# Patient Record
Sex: Female | Born: 1995
Health system: Southern US, Community
[De-identification: ages and names within clinical notes are randomized; demographics above are authoritative.]

## PROBLEM LIST (undated history)

## (undated) DIAGNOSIS — F32A Depression, unspecified: Secondary | ICD-10-CM

## (undated) DIAGNOSIS — R519 Headache, unspecified: Secondary | ICD-10-CM

## (undated) DIAGNOSIS — F419 Anxiety disorder, unspecified: Secondary | ICD-10-CM

## (undated) HISTORY — DX: Headache, unspecified: R51.9

---

## 2016-12-03 DIAGNOSIS — F411 Generalized anxiety disorder: Secondary | ICD-10-CM | POA: Diagnosis not present

## 2017-02-11 DIAGNOSIS — F411 Generalized anxiety disorder: Secondary | ICD-10-CM | POA: Diagnosis not present

## 2017-02-11 DIAGNOSIS — F321 Major depressive disorder, single episode, moderate: Secondary | ICD-10-CM | POA: Diagnosis not present

## 2017-04-03 DIAGNOSIS — Z113 Encounter for screening for infections with a predominantly sexual mode of transmission: Secondary | ICD-10-CM | POA: Diagnosis not present

## 2017-04-03 DIAGNOSIS — Z3009 Encounter for other general counseling and advice on contraception: Secondary | ICD-10-CM | POA: Diagnosis not present

## 2017-04-03 DIAGNOSIS — Z124 Encounter for screening for malignant neoplasm of cervix: Secondary | ICD-10-CM | POA: Diagnosis not present

## 2017-04-03 DIAGNOSIS — Z1151 Encounter for screening for human papillomavirus (HPV): Secondary | ICD-10-CM | POA: Diagnosis not present

## 2017-04-03 DIAGNOSIS — Z3041 Encounter for surveillance of contraceptive pills: Secondary | ICD-10-CM | POA: Diagnosis not present

## 2017-11-20 DIAGNOSIS — F324 Major depressive disorder, single episode, in partial remission: Secondary | ICD-10-CM | POA: Diagnosis not present

## 2017-11-20 DIAGNOSIS — F411 Generalized anxiety disorder: Secondary | ICD-10-CM | POA: Diagnosis not present

## 2018-05-06 DIAGNOSIS — R87610 Atypical squamous cells of undetermined significance on cytologic smear of cervix (ASC-US): Secondary | ICD-10-CM | POA: Diagnosis not present

## 2018-05-06 DIAGNOSIS — Z01419 Encounter for gynecological examination (general) (routine) without abnormal findings: Secondary | ICD-10-CM | POA: Diagnosis not present

## 2018-05-06 DIAGNOSIS — Z113 Encounter for screening for infections with a predominantly sexual mode of transmission: Secondary | ICD-10-CM | POA: Diagnosis not present

## 2018-11-05 DIAGNOSIS — J069 Acute upper respiratory infection, unspecified: Secondary | ICD-10-CM | POA: Diagnosis not present

## 2018-12-21 DIAGNOSIS — G43909 Migraine, unspecified, not intractable, without status migrainosus: Secondary | ICD-10-CM | POA: Diagnosis not present

## 2019-02-01 ENCOUNTER — Other Ambulatory Visit: Payer: Self-pay

## 2019-02-01 ENCOUNTER — Ambulatory Visit: Payer: BC Managed Care – PPO | Admitting: Neurology

## 2019-02-01 ENCOUNTER — Encounter: Payer: Self-pay | Admitting: Neurology

## 2019-02-01 DIAGNOSIS — G441 Vascular headache, not elsewhere classified: Secondary | ICD-10-CM

## 2019-02-01 DIAGNOSIS — R519 Headache, unspecified: Secondary | ICD-10-CM | POA: Insufficient documentation

## 2019-02-01 MED ORDER — TOPIRAMATE 25 MG PO TABS: 50.0000 mg | ORAL_TABLET | Freq: Every day | ORAL | 3 refills | Status: AC

## 2019-02-01 MED ORDER — SUMATRIPTAN SUCCINATE 50 MG PO TABS: 50.0000 mg | ORAL_TABLET | ORAL | 1 refills | Status: AC | PRN

## 2019-02-01 NOTE — Patient Instructions (Signed)
Topiramate tablets What is this medicine? TOPIRAMATE (toe PYRE a mate) is used to treat seizures in adults or children with epilepsy. It is also used for the prevention of migraine headaches. This medicine may be used for other purposes; ask your health care provider or pharmacist if you have questions. COMMON BRAND NAME(S): Topamax, Topiragen What should I tell my health care provider before I take this medicine? They need to know if you have any of these conditions:  bleeding disorders  cirrhosis of the liver or liver disease  diarrhea  glaucoma  kidney stones or kidney disease  low blood counts, like low white cell, platelet, or red cell counts  lung disease like asthma, obstructive pulmonary disease, emphysema  metabolic acidosis  on a ketogenic diet  schedule for surgery or a procedure  suicidal thoughts, plans, or attempt; a previous suicide attempt by you or a family member  an unusual or allergic reaction to topiramate, other medicines, foods, dyes, or preservatives  pregnant or trying to get pregnant  breast-feeding How should I use this medicine? Take this medicine by mouth with a glass of water. Follow the directions on the prescription label. Do not crush or chew. You may take this medicine with meals. Take your medicine at regular intervals. Do not take it more often than directed. Talk to your pediatrician regarding the use of this medicine in children. Special care may be needed. While this drug may be prescribed for children as young as 2 years of age for selected conditions, precautions do apply. Overdosage: If you think you have taken too much of this medicine contact a poison control center or emergency room at once. NOTE: This medicine is only for you. Do not share this medicine with others. What if I miss a dose? If you miss a dose, take it as soon as you can. If your next dose is to be taken in less than 6 hours, then do not take the missed dose. Take  the next dose at your regular time. Do not take double or extra doses. What may interact with this medicine? Do not take this medicine with any of the following medications:  probenecid This medicine may also interact with the following medications:  acetazolamide  alcohol  amitriptyline  aspirin and aspirin-like medicines  birth control pills  certain medicines for depression  certain medicines for seizures  certain medicines that treat or prevent blood clots like warfarin, enoxaparin, dalteparin, apixaban, dabigatran, and rivaroxaban  digoxin  hydrochlorothiazide  lithium  medicines for pain, sleep, or muscle relaxation  metformin  methazolamide  NSAIDS, medicines for pain and inflammation, like ibuprofen or naproxen  pioglitazone  risperidone This list may not describe all possible interactions. Give your health care provider a list of all the medicines, herbs, non-prescription drugs, or dietary supplements you use. Also tell them if you smoke, drink alcohol, or use illegal drugs. Some items may interact with your medicine. What should I watch for while using this medicine? Visit your doctor or health care professional for regular checks on your progress. Do not stop taking this medicine suddenly. This increases the risk of seizures if you are using this medicine to control epilepsy. Wear a medical identification bracelet or chain to say you have epilepsy or seizures, and carry a card that lists all your medicines. This medicine can decrease sweating and increase your body temperature. Watch for signs of deceased sweating or fever, especially in children. Avoid extreme heat, hot baths, and saunas. Be careful   about exercising, especially in hot weather. Contact your health care provider right away if you notice a fever or decrease in sweating. You should drink plenty of fluids while taking this medicine. If you have had kidney stones in the past, this will help to reduce  your chances of forming kidney stones. If you have stomach pain, with nausea or vomiting and yellowing of your eyes or skin, call your doctor immediately. You may get drowsy, dizzy, or have blurred vision. Do not drive, use machinery, or do anything that needs mental alertness until you know how this medicine affects you. To reduce dizziness, do not sit or stand up quickly, especially if you are an older patient. Alcohol can increase drowsiness and dizziness. Avoid alcoholic drinks. If you notice blurred vision, eye pain, or other eye problems, seek medical attention at once for an eye exam. The use of this medicine may increase the chance of suicidal thoughts or actions. Pay special attention to how you are responding while on this medicine. Any worsening of mood, or thoughts of suicide or dying should be reported to your health care professional right away. This medicine may increase the chance of developing metabolic acidosis. If left untreated, this can cause kidney stones, bone disease, or slowed growth in children. Symptoms include breathing fast, fatigue, loss of appetite, irregular heartbeat, or loss of consciousness. Call your doctor immediately if you experience any of these side effects. Also, tell your doctor about any surgery you plan on having while taking this medicine since this may increase your risk for metabolic acidosis. Birth control pills may not work properly while you are taking this medicine. Talk to your doctor about using an extra method of birth control. Women who become pregnant while using this medicine may enroll in the [place] American Antiepileptic Drug Pregnancy Registry by calling 1-888-233-2334. This registry collects information about the safety of antiepileptic drug use during pregnancy. What side effects may I notice from receiving this medicine? Side effects that you should report to your doctor or health care professional as soon as possible:  allergic reactions like  skin rash, itching or hives, swelling of the face, lips, or tongue  decreased sweating and/or rise in body temperature  depression  difficulty breathing, fast or irregular breathing patterns  difficulty speaking  difficulty walking or controlling muscle movements  hearing impairment  redness, blistering, peeling or loosening of the skin, including inside the mouth  tingling, pain or numbness in the hands or feet  unusual bleeding or bruising  unusually weak or tired  worsening of mood, thoughts or actions of suicide or dying Side effects that usually do not require medical attention (report to your doctor or health care professional if they continue or are bothersome):  altered taste  back pain, joint or muscle aches and pains  diarrhea, or constipation  headache  loss of appetite  nausea  stomach upset, indigestion  tremors This list may not describe all possible side effects. Call your doctor for medical advice about side effects. You may report side effects to FDA at 1-800-FDA-1088. Where should I keep my medicine? Keep out of the reach of children. Store at room temperature between 15 and 30 degrees C (59 and 86 degrees F) in a tightly closed container. Protect from moisture. Throw away any unused medicine after the expiration date. NOTE: This sheet is a summary. It may not cover all possible information. If you have questions about this medicine, talk to your doctor, pharmacist, or health care   provider.  2020 Elsevier/Gold Standard (2013-03-28 23:17:57) Sumatriptan tablets What is this medicine? SUMATRIPTAN (soo ma TRIP tan) is used to treat migraines with or without aura. An aura is a strange feeling or visual disturbance that warns you of an attack. It is not used to prevent migraines. This medicine may be used for other purposes; ask your health care provider or pharmacist if you have questions. COMMON BRAND NAME(S): Imitrex, Migraine Pack What should I  tell my health care provider before I take this medicine? They need to know if you have any of these conditions:  cigarette smoker  circulation problems in fingers and toes  diabetes  heart disease  high blood pressure  high cholesterol  history of irregular heartbeat  history of stroke  kidney disease  liver disease  stomach or intestine problems  an unusual or allergic reaction to sumatriptan, other medicines, foods, dyes, or preservatives  pregnant or trying to get pregnant  breast-feeding How should I use this medicine? Take this medicine by mouth with a glass of water. Follow the directions on the prescription label. Do not take it more often than directed. Talk to your pediatrician regarding the use of this medicine in children. Special care may be needed. Overdosage: If you think you have taken too much of this medicine contact a poison control center or emergency room at once. NOTE: This medicine is only for you. Do not share this medicine with others. What if I miss a dose? This does not apply. This medicine is not for regular use. What may interact with this medicine? Do not take this medicine with any of the following medicines:  certain medicines for migraine headache like almotriptan, eletriptan, frovatriptan, naratriptan, rizatriptan, sumatriptan, zolmitriptan  ergot alkaloids like dihydroergotamine, ergonovine, ergotamine, methylergonovine  MAOIs like Carbex, Eldepryl, Marplan, Nardil, and Parnate This medicine may also interact with the following medications:  certain medicines for depression, anxiety, or psychotic disorders This list may not describe all possible interactions. Give your health care provider a list of all the medicines, herbs, non-prescription drugs, or dietary supplements you use. Also tell them if you smoke, drink alcohol, or use illegal drugs. Some items may interact with your medicine. What should I watch for while using this  medicine? Visit your healthcare professional for regular checks on your progress. Tell your healthcare professional if your symptoms do not start to get better or if they get worse. You may get drowsy or dizzy. Do not drive, use machinery, or do anything that needs mental alertness until you know how this medicine affects you. Do not stand up or sit up quickly, especially if you are an older patient. This reduces the risk of dizzy or fainting spells. Alcohol may interfere with the effect of this medicine. Tell your healthcare professional right away if you have any change in your eyesight. If you take migraine medicines for 10 or more days a month, your migraines may get worse. Keep a diary of headache days and medicine use. Contact your healthcare professional if your migraine attacks occur more frequently. What side effects may I notice from receiving this medicine? Side effects that you should report to your doctor or health care professional as soon as possible:  allergic reactions like skin rash, itching or hives, swelling of the face, lips, or tongue  changes in vision  chest pain or chest tightness  signs and symptoms of a dangerous change in heartbeat or heart rhythm like chest pain; dizziness; fast, irregular heartbeat; palpitations; feeling  faint or lightheaded; falls; breathing problems  signs and symptoms of a stroke like changes in vision; confusion; trouble speaking or understanding; severe headaches; sudden numbness or weakness of the face, arm or leg; trouble walking; dizziness; loss of balance or coordination  signs and symptoms of serotonin syndrome like irritable; confusion; diarrhea; fast or irregular heartbeat; muscle twitching; stiff muscles; trouble walking; sweating; high fever; seizures; chills; vomiting Side effects that usually do not require medical attention (report to your doctor or health care professional if they continue or are  bothersome):  diarrhea  dizziness  drowsiness  dry mouth  headache  nausea, vomiting  pain, tingling, numbness in the hands or feet  stomach pain This list may not describe all possible side effects. Call your doctor for medical advice about side effects. You may report side effects to FDA at 1-800-FDA-1088. Where should I keep my medicine? Keep out of the reach of children. Store at room temperature between 2 and 30 degrees C (36 and 86 degrees F). Throw away any unused medicine after the expiration date. NOTE: This sheet is a summary. It may not cover all possible information. If you have questions about this medicine, talk to your doctor, pharmacist, or health care provider.  2020 Elsevier/Gold Standard (2017-10-06 15:05:37)

## 2019-02-01 NOTE — Progress Notes (Signed)
.       Provider:  Melvyn Novasarmen  Landri Dorsainvil, MD  Primary Care Physician:  Deatra JamesSun, Vyvyan, MD [phone] [address] [place] [id number]     Referring Provider: Assunta FoundSullivan, Emily B, Gean Birchwooda [address] [place] [id number]  [facility]          Chief Complaint according to patient   Patient presents with:    . New Patient (Initial Visit)           HISTORY OF PRESENT ILLNESS:  Anna Bennett is a 23 y.o. 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  Seen on 02/01/2019 for migraine.    Chief concern according to patient :  See below    I have the pleasure of seeing Anna Bennett today, a right-handed female with a headache disorder. She  has a past medical history of Headache.  Anna Bennett reports that she encountered migraines already before puberty in childhood.  There has been a strong maternal family history with her mother being affected by migraines as well as a maternal cousin. It was after the patient began taking birth control pills that her migraines became very frequent more intense, and having up to 3 times a week.  This exacerbation took probably place around 2-1/2 years ago.  She is taking Sprintec but stopped taking this medication in mid September 2020, Sprintec contains norgestimate ethinyl estradiol and 2 different doses with a 28-day pack.  She states that the headaches sometimes last 3 days ago which would constitute a status migrainosus.  The pain is intermittent and occurs with a frequency of a few times a week but rarely does it last 7 days. She localized mostly to the right side, she has undergone migraine piercing , and believes this already has reduced the intensity.  Nausea with migraine- rarely vomiting , photophobia, phonophobia.  She has woken up with migraines, mostly the migraines evolve later in th day and get more intense as the day goes on.  Ibuprofen and midol has been the only medication tried.      Sleep habits are as follows: The patient's dinner time is between 6 PM. The patient goes to bed at 11 PM and continues to be awake until 3-4 AM, she  sleep for 6 hours. wakes for 1-2 bathroom breaks. The preferred sleep position is any position, with the support of 2 pillows and a lumbar pillow.  Dreams are reportedly rare.  9-10  AM is the usual rise time. The patient wakes up spontaneously with an alarm.  She reports not feeling refreshed or restored in AM, Naps are taken in frequently.  She is avoiding caffeine.   Review of Systems: Out of a complete 14 system review, the patient complains of only the following symptoms, and all other reviewed systems are negative.:  Fatigue, sleepiness , snoring, fragmented sleep, Insomnia , delayed sleep , migraine.    How likely are you to doze in the following situations: 0 = not likely, 1 = slight chance, 2 = moderate chance, 3 = high chance   Sitting and Reading? Watching Television? Sitting inactive in a public place (theater or meeting)? As a passenger in a car for an hour without a break? Lying down in the afternoon when circumstances permit? Sitting and talking to someone? Sitting quietly after lunch without alcohol? In a car, while stopped for a few minutes in traffic?   Total =  NA / 24 points     Social History   Socioeconomic History  . Marital status: Single    Spouse name: Not on file  . Number of children: Not on file  . Years of education: Not on file  . Highest education level: Not on file  Occupational History  . Not on file  Social Needs  . Financial resource strain: Not on file  . Food insecurity    Worry: Not on file    Inability: Not on file  . Transportation needs    Medical: Not on file    Non-medical: Not on file  Tobacco Use  . Smoking status: Never Smoker  . Smokeless tobacco: Never Used  Substance and Sexual Activity  . Alcohol use: Yes    Alcohol/week: 2.0 standard drinks    Types: 1 Glasses of wine,  1 Shots of liquor per week  . Drug use: Not Currently  . Sexual activity: Not on file  Lifestyle  . Physical activity    Days per week: Not on file    Minutes per session: Not on file  . Stress: Not on file  Relationships  . Social Musician on phone: Not on file    Gets together: Not on file    Attends religious service: Not on file    Active member of club or organization: Not on file    Attends meetings of clubs or organizations: Not on file    Relationship status: Not on file  Other Topics Concern  . Not on file  Social History Narrative  . Not on file    History reviewed. No pertinent family history.  Past Medical History:  Diagnosis Date  . Headache     History reviewed. No pertinent surgical history.   No current outpatient medications on file prior to visit.   No current facility-administered medications on file prior to visit.     Allergies  Allergen Reactions  . Sulfa Antibiotics     Physical exam:  Today's Vitals   02/01/19 1409  BP: 124/75  Pulse: 82  Temp: 97.7 F (36.5 C)  Weight: 135 lb (61.2 kg)  Height: 5\' 5"  (1.651 m)   Body mass index is 22.47 kg/m.   Wt Readings from Last 3 Encounters:  02/01/19 135 lb (61.2 kg)     Ht Readings from Last 3 Encounters:  02/01/19 5\' 5"  (1.651 m)      General: The patient is awake, alert and appears not in acute distress. The patient is well groomed. Head: Normocephalic, atraumatic. Neck is supple.  neck circumference: 14 inches . Nasal airflow is patent. She has frequent congestion. No postnasal drip.  Retrognathia is not  seen.  Dental status: normal  Cardiovascular:  Regular rate Respiratory: Lungs are clear  Skin:  Without evidence of ankle edema, or rash.  Tattooes.  Tender spots on both temples.  Trunk: The patient's posture is erect.   Neurologic exam : The patient is awake and alert, oriented to place and time.   Memory subjective described as intact.  Attention span &  concentration ability appears normal.  Speech is fluent,  without  dysarthria, dysphonia or aphasia.  Mood and affect are appropriate.   Cranial nerves: no loss of smell or taste reported  Pupils are equal and briskly reactive to light. Funduscopic exam deferred.  Extraocular movements in vertical and horizontal planes were intact and without nystagmus. No Diplopia. Visual fields by finger perimetry are intact. Hearing  was intact to soft voice and finger rubbing.    Facial sensation intact to fine touch.  Facial motor strength is symmetric and tongue and uvula move midline.  Neck ROM : rotation, tilt and flexion extension were normal for age and shoulder shrug was symmetrical.    Motor exam:  Symmetric bulk, tone and ROM.   Normal tone without cog wheeling, symmetric grip strength. Sensory:  Fine touch, pinprick and vibration were tested  and  normal.  Proprioception tested in the upper extremities was normal. Coordination: Rapid alternating movements in the fingers/hands were of normal speed.  The Finger-to-nose maneuver was intact without evidence of ataxia, dysmetria or tremor.   Gait and station: Patient walked without assistive device.  Stance is of normal width/ Deep tendon reflexes: in the upper and lower extremities are symmetric and intact.  Babinski response was deferred.        After spending a total time of 40  minutes face to face and additional time for physical and neurologic examination, review of laboratory studies,  personal review of imaging studies, reports and results of other testing and review of referral information / records as far as provided in visit, I have established the following assessments:  1) patient with long standing migrainous headaches that became more frequent as she took hormonal contraceptives.  Now easily more days with headaches than without.  Visual auras with many of the migraines, preceding the headaches 30 minutes.   2) migraine  temporal, nausea, photo- and phono- phobia.     My Plan is to proceed with:  1)will start a headache journal   2) improve sleep routines.   3) Topiramate 25 mg , we start one at night and after 8 days  go to 2 at night.    4) Imitrex.   I would like to thank Donald Prose, MD and Lula Olszewski, [place],  [place] [address] for allowing me to meet with and to take care of this pleasant patient.  I plan to follow up either personally or through our NP within 4-6 month.   CC: I will share my notes with PCP   Electronically signed by: Larey Seat, MD 02/01/2019 3:00 PM  [facility] Sleep Board certified by The [employer] of Sleep Medicine and Diplomate of the [employer] of Sleep Medicine. Board certified In Neurology through the [place], Fellow of the [employer] of Neurology. Medical Director of [employer].

## 2019-02-07 ENCOUNTER — Telehealth: Payer: Self-pay | Admitting: Neurology

## 2019-02-07 NOTE — Telephone Encounter (Signed)
no to covid questions MR Brain wo contrast Dr. Rolla Etienne Auth: [id number] (exp. 02/07/19 to 08/05/19). Patient is scheduled at [facility] for 03/09/19.

## 2019-02-09 NOTE — Telephone Encounter (Signed)
Pt called in and stated she has ear piercing's that cant be taken out and she wants to know what she can do

## 2019-02-09 NOTE — Telephone Encounter (Signed)
I spoke to the patient and informed her that she would have to go to a tattoo place and ask them if they can put a little plastic piece in place of the piercing. She stated she would give them a call to see if they can do that.

## 2019-03-01 ENCOUNTER — Telehealth: Payer: Self-pay | Admitting: Neurology

## 2019-03-01 NOTE — Telephone Encounter (Signed)
Called the pt to question exactly what she was asking. Patient states that she had increased her medication and was tolerating the 2 at bedtime. She went a couple days without her dose and her pharmacy was wanting to confirm with [place] if it is ok with her to resume the 2 at bedtime since she was without the medication for 2 days advised that would be fine especially since she was tolerating the dosage before. Patient has MRI scheduled 12/2 and then a follow up visit in [place] and advised that I would call with the results when I get them and we will look forward for seeing her at the visit in jan. Pt verbalized understanding. Pt had no questions at this time but was encouraged to call back if questions arise.

## 2019-03-01 NOTE — Telephone Encounter (Signed)
Patient called stating that when she initially received her topiramate she began taking 8 pills a day however, during this time she was moving and stopped taking her medication and was advised by the pharmacist to take one a night and to FU about what dosage she should be taking since stoping and resuming medication. Please follow up.

## 2019-03-09 ENCOUNTER — Other Ambulatory Visit: Payer: Self-pay

## 2019-03-09 ENCOUNTER — Ambulatory Visit: Payer: BC Managed Care – PPO

## 2019-03-09 DIAGNOSIS — G441 Vascular headache, not elsewhere classified: Secondary | ICD-10-CM

## 2019-03-09 MED ORDER — GADOBENATE DIMEGLUMINE 529 MG/ML IV SOLN: 20.0000 mL | Freq: Once | INTRAVENOUS | Status: AC | PRN

## 2019-03-14 ENCOUNTER — Telehealth: Payer: Self-pay | Admitting: Neurology

## 2019-03-14 NOTE — Telephone Encounter (Signed)
-----   Message from Larey Seat, MD sent at 03/10/2019  5:38 PM EST ----- Normal brain MRI result.

## 2019-03-14 NOTE — Telephone Encounter (Signed)
Called the patient and there was no answer. LVM informing the patient that MRI of the brain was normal. Advised if she had any questions to call back.

## 2019-05-04 ENCOUNTER — Ambulatory Visit: Payer: BC Managed Care – PPO | Admitting: Family Medicine

## 2019-09-12 ENCOUNTER — Ambulatory Visit: Payer: Self-pay

## 2020-09-17 DIAGNOSIS — R040 Epistaxis: Secondary | ICD-10-CM | POA: Diagnosis not present

## 2020-09-17 DIAGNOSIS — Z0000 Encounter for general adult medical examination without abnormal findings: Secondary | ICD-10-CM | POA: Diagnosis not present

## 2020-09-17 DIAGNOSIS — M6749 Ganglion, multiple sites: Secondary | ICD-10-CM | POA: Diagnosis not present

## 2020-09-17 DIAGNOSIS — L508 Other urticaria: Secondary | ICD-10-CM | POA: Diagnosis not present

## 2020-10-05 ENCOUNTER — Other Ambulatory Visit: Payer: Self-pay | Admitting: Family Medicine

## 2020-10-05 ENCOUNTER — Ambulatory Visit
Admission: RE | Admit: 2020-10-05 | Discharge: 2020-10-05 | Disposition: A | Discharge status: Home / Self Care | Payer: BC Managed Care – PPO | Source: Ambulatory Visit | Attending: Family Medicine | Admitting: Family Medicine

## 2020-10-05 ENCOUNTER — Other Ambulatory Visit: Payer: Self-pay

## 2020-10-05 DIAGNOSIS — M542 Cervicalgia: Secondary | ICD-10-CM

## 2020-10-05 DIAGNOSIS — M25561 Pain in right knee: Secondary | ICD-10-CM | POA: Diagnosis not present

## 2020-10-05 DIAGNOSIS — M545 Low back pain, unspecified: Secondary | ICD-10-CM

## 2020-10-05 DIAGNOSIS — R41840 Attention and concentration deficit: Secondary | ICD-10-CM | POA: Diagnosis not present

## 2020-10-22 DIAGNOSIS — Z3009 Encounter for other general counseling and advice on contraception: Secondary | ICD-10-CM | POA: Diagnosis not present

## 2020-10-22 DIAGNOSIS — Z01419 Encounter for gynecological examination (general) (routine) without abnormal findings: Secondary | ICD-10-CM | POA: Diagnosis not present

## 2020-10-22 DIAGNOSIS — Z8742 Personal history of other diseases of the female genital tract: Secondary | ICD-10-CM | POA: Diagnosis not present

## 2020-10-22 DIAGNOSIS — Z23 Encounter for immunization: Secondary | ICD-10-CM | POA: Diagnosis not present

## 2020-10-24 DIAGNOSIS — R040 Epistaxis: Secondary | ICD-10-CM | POA: Diagnosis not present

## 2020-10-24 DIAGNOSIS — J343 Hypertrophy of nasal turbinates: Secondary | ICD-10-CM | POA: Diagnosis not present

## 2020-10-24 DIAGNOSIS — J342 Deviated nasal septum: Secondary | ICD-10-CM | POA: Diagnosis not present

## 2020-10-26 DIAGNOSIS — R2231 Localized swelling, mass and lump, right upper limb: Secondary | ICD-10-CM | POA: Diagnosis not present

## 2020-11-16 DIAGNOSIS — R2231 Localized swelling, mass and lump, right upper limb: Secondary | ICD-10-CM | POA: Diagnosis not present

## 2020-12-14 DIAGNOSIS — F411 Generalized anxiety disorder: Secondary | ICD-10-CM | POA: Diagnosis not present

## 2020-12-14 DIAGNOSIS — F439 Reaction to severe stress, unspecified: Secondary | ICD-10-CM | POA: Diagnosis not present

## 2020-12-17 DIAGNOSIS — Z23 Encounter for immunization: Secondary | ICD-10-CM | POA: Diagnosis not present

## 2021-01-04 DIAGNOSIS — F411 Generalized anxiety disorder: Secondary | ICD-10-CM | POA: Diagnosis not present

## 2021-01-04 DIAGNOSIS — F439 Reaction to severe stress, unspecified: Secondary | ICD-10-CM | POA: Diagnosis not present

## 2021-01-08 DIAGNOSIS — J342 Deviated nasal septum: Secondary | ICD-10-CM | POA: Diagnosis not present

## 2021-01-08 DIAGNOSIS — J3489 Other specified disorders of nose and nasal sinuses: Secondary | ICD-10-CM | POA: Diagnosis not present

## 2021-01-08 DIAGNOSIS — R040 Epistaxis: Secondary | ICD-10-CM | POA: Diagnosis not present

## 2021-01-08 DIAGNOSIS — J343 Hypertrophy of nasal turbinates: Secondary | ICD-10-CM | POA: Diagnosis not present

## 2021-01-18 DIAGNOSIS — F439 Reaction to severe stress, unspecified: Secondary | ICD-10-CM | POA: Diagnosis not present

## 2021-01-18 DIAGNOSIS — F411 Generalized anxiety disorder: Secondary | ICD-10-CM | POA: Diagnosis not present

## 2021-01-21 DIAGNOSIS — F411 Generalized anxiety disorder: Secondary | ICD-10-CM | POA: Diagnosis not present

## 2021-01-21 DIAGNOSIS — F439 Reaction to severe stress, unspecified: Secondary | ICD-10-CM | POA: Diagnosis not present

## 2021-02-04 DIAGNOSIS — F411 Generalized anxiety disorder: Secondary | ICD-10-CM | POA: Diagnosis not present

## 2021-02-04 DIAGNOSIS — F439 Reaction to severe stress, unspecified: Secondary | ICD-10-CM | POA: Diagnosis not present

## 2021-02-08 DIAGNOSIS — R109 Unspecified abdominal pain: Secondary | ICD-10-CM | POA: Diagnosis not present

## 2021-02-08 DIAGNOSIS — Z23 Encounter for immunization: Secondary | ICD-10-CM | POA: Diagnosis not present

## 2021-02-08 DIAGNOSIS — R079 Chest pain, unspecified: Secondary | ICD-10-CM | POA: Diagnosis not present

## 2021-02-19 DIAGNOSIS — U071 COVID-19: Secondary | ICD-10-CM | POA: Diagnosis not present

## 2021-02-19 DIAGNOSIS — R509 Fever, unspecified: Secondary | ICD-10-CM | POA: Diagnosis not present

## 2021-02-19 DIAGNOSIS — R519 Headache, unspecified: Secondary | ICD-10-CM | POA: Diagnosis not present

## 2021-02-19 DIAGNOSIS — J029 Acute pharyngitis, unspecified: Secondary | ICD-10-CM | POA: Diagnosis not present

## 2021-03-22 DIAGNOSIS — F439 Reaction to severe stress, unspecified: Secondary | ICD-10-CM | POA: Diagnosis not present

## 2021-03-22 DIAGNOSIS — F411 Generalized anxiety disorder: Secondary | ICD-10-CM | POA: Diagnosis not present

## 2021-04-12 DIAGNOSIS — Z23 Encounter for immunization: Secondary | ICD-10-CM | POA: Diagnosis not present

## 2021-04-26 DIAGNOSIS — F439 Reaction to severe stress, unspecified: Secondary | ICD-10-CM | POA: Diagnosis not present

## 2021-04-26 DIAGNOSIS — F411 Generalized anxiety disorder: Secondary | ICD-10-CM | POA: Diagnosis not present

## 2021-04-29 DIAGNOSIS — F439 Reaction to severe stress, unspecified: Secondary | ICD-10-CM | POA: Diagnosis not present

## 2021-04-29 DIAGNOSIS — F411 Generalized anxiety disorder: Secondary | ICD-10-CM | POA: Diagnosis not present

## 2021-04-30 DIAGNOSIS — F411 Generalized anxiety disorder: Secondary | ICD-10-CM | POA: Diagnosis not present

## 2021-04-30 DIAGNOSIS — F439 Reaction to severe stress, unspecified: Secondary | ICD-10-CM | POA: Diagnosis not present

## 2021-05-20 DIAGNOSIS — F411 Generalized anxiety disorder: Secondary | ICD-10-CM | POA: Diagnosis not present

## 2021-05-20 DIAGNOSIS — F439 Reaction to severe stress, unspecified: Secondary | ICD-10-CM | POA: Diagnosis not present

## 2021-05-24 DIAGNOSIS — F439 Reaction to severe stress, unspecified: Secondary | ICD-10-CM | POA: Diagnosis not present

## 2021-05-24 DIAGNOSIS — F411 Generalized anxiety disorder: Secondary | ICD-10-CM | POA: Diagnosis not present

## 2021-07-05 DIAGNOSIS — F411 Generalized anxiety disorder: Secondary | ICD-10-CM | POA: Diagnosis not present

## 2021-07-05 DIAGNOSIS — F439 Reaction to severe stress, unspecified: Secondary | ICD-10-CM | POA: Diagnosis not present

## 2021-08-08 DIAGNOSIS — F411 Generalized anxiety disorder: Secondary | ICD-10-CM | POA: Diagnosis not present

## 2021-08-08 DIAGNOSIS — F439 Reaction to severe stress, unspecified: Secondary | ICD-10-CM | POA: Diagnosis not present

## 2021-08-16 DIAGNOSIS — F439 Reaction to severe stress, unspecified: Secondary | ICD-10-CM | POA: Diagnosis not present

## 2021-08-16 DIAGNOSIS — F411 Generalized anxiety disorder: Secondary | ICD-10-CM | POA: Diagnosis not present

## 2021-09-13 DIAGNOSIS — F411 Generalized anxiety disorder: Secondary | ICD-10-CM | POA: Diagnosis not present

## 2021-09-13 DIAGNOSIS — F439 Reaction to severe stress, unspecified: Secondary | ICD-10-CM | POA: Diagnosis not present

## 2021-09-20 DIAGNOSIS — Z0000 Encounter for general adult medical examination without abnormal findings: Secondary | ICD-10-CM | POA: Diagnosis not present

## 2021-09-20 DIAGNOSIS — Z13220 Encounter for screening for lipoid disorders: Secondary | ICD-10-CM | POA: Diagnosis not present

## 2021-11-08 DIAGNOSIS — N946 Dysmenorrhea, unspecified: Secondary | ICD-10-CM | POA: Diagnosis not present

## 2021-11-08 DIAGNOSIS — Z130 Encounter for screening for diseases of the blood and blood-forming organs and certain disorders involving the immune mechanism: Secondary | ICD-10-CM | POA: Diagnosis not present

## 2021-11-08 DIAGNOSIS — Z01419 Encounter for gynecological examination (general) (routine) without abnormal findings: Secondary | ICD-10-CM | POA: Diagnosis not present

## 2021-11-08 DIAGNOSIS — Z91410 Personal history of adult physical and sexual abuse: Secondary | ICD-10-CM | POA: Diagnosis not present

## 2021-12-16 DIAGNOSIS — N809 Endometriosis, unspecified: Secondary | ICD-10-CM | POA: Diagnosis not present

## 2022-03-03 DIAGNOSIS — R41840 Attention and concentration deficit: Secondary | ICD-10-CM | POA: Diagnosis not present

## 2022-03-11 ENCOUNTER — Encounter (HOSPITAL_BASED_OUTPATIENT_CLINIC_OR_DEPARTMENT_OTHER): Payer: Self-pay | Admitting: Obstetrics and Gynecology

## 2022-03-11 NOTE — Progress Notes (Signed)
Spoke w/ via phone for pre-op interview--- [facility] Lab needs dos---- UPT per anesthesia. Surgeon orders pending.              Lab results------ COVID test -----patient states asymptomatic no test needed Arrive at -------0645 NPO after MN NO Solid Food.   Med rec completed Medications to take morning of surgery -----NONE Diabetic medication ----- Patient instructed no nail polish to be worn day of surgery Patient instructed to bring photo id and insurance card day of surgery Patient aware to have Driver (ride ) / caregiver Anna Bennett   for 24 hours after surgery  Patient Special Instructions ----- Pre-Op special Istructions ----- Patient verbalized understanding of instructions that were given at this phone interview. Patient denies shortness of breath, chest pain, fever, cough at this phone interview.
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
## 2022-03-14 ENCOUNTER — Encounter (HOSPITAL_BASED_OUTPATIENT_CLINIC_OR_DEPARTMENT_OTHER): Payer: Self-pay | Admitting: Obstetrics and Gynecology

## 2022-03-14 ENCOUNTER — Ambulatory Visit (HOSPITAL_BASED_OUTPATIENT_CLINIC_OR_DEPARTMENT_OTHER)
Admission: RE | Admit: 2022-03-14 | Discharge: 2022-03-14 | Disposition: A | Discharge status: Home / Self Care | Payer: BC Managed Care – PPO | Source: Ambulatory Visit | Attending: Obstetrics and Gynecology | Admitting: Obstetrics and Gynecology

## 2022-03-14 ENCOUNTER — Other Ambulatory Visit: Payer: Self-pay

## 2022-03-14 ENCOUNTER — Ambulatory Visit (HOSPITAL_BASED_OUTPATIENT_CLINIC_OR_DEPARTMENT_OTHER): Payer: BC Managed Care – PPO | Admitting: Anesthesiology

## 2022-03-14 ENCOUNTER — Encounter (HOSPITAL_BASED_OUTPATIENT_CLINIC_OR_DEPARTMENT_OTHER)
Admission: RE | Disposition: A | Discharge status: Home / Self Care | Payer: Self-pay | Source: Ambulatory Visit | Attending: Obstetrics and Gynecology

## 2022-03-14 DIAGNOSIS — N858 Other specified noninflammatory disorders of uterus: Secondary | ICD-10-CM | POA: Diagnosis not present

## 2022-03-14 DIAGNOSIS — Z01818 Encounter for other preprocedural examination: Secondary | ICD-10-CM

## 2022-03-14 DIAGNOSIS — N736 Female pelvic peritoneal adhesions (postinfective): Secondary | ICD-10-CM | POA: Diagnosis not present

## 2022-03-14 DIAGNOSIS — R102 Pelvic and perineal pain: Secondary | ICD-10-CM | POA: Diagnosis not present

## 2022-03-14 DIAGNOSIS — G8929 Other chronic pain: Secondary | ICD-10-CM | POA: Diagnosis not present

## 2022-03-14 DIAGNOSIS — N809 Endometriosis, unspecified: Secondary | ICD-10-CM | POA: Diagnosis not present

## 2022-03-14 HISTORY — DX: Depression, unspecified: F32.A

## 2022-03-14 HISTORY — DX: Anxiety disorder, unspecified: F41.9

## 2022-03-14 HISTORY — PX: ABLATION ON ENDOMETRIOSIS: SHX5787

## 2022-03-14 HISTORY — PX: LAPAROSCOPY: SHX197

## 2022-03-14 HISTORY — PX: HYSTEROSCOPY: SHX211

## 2022-03-14 LAB — CBC
HCT: 39.7 % (ref 36.0–46.0)
Hemoglobin: 12.7 g/dL (ref 12.0–15.0)
MCH: 27.9 pg (ref 26.0–34.0)
MCHC: 32 g/dL (ref 30.0–36.0)
MCV: 87.3 fL (ref 80.0–100.0)
Platelets: 315 10*3/uL (ref 150–400)
RBC: 4.55 MIL/uL (ref 3.87–5.11)
RDW: 12.1 % (ref 11.5–15.5)
WBC: 6.4 10*3/uL (ref 4.0–10.5)
nRBC: 0 % (ref 0.0–0.2)

## 2022-03-14 LAB — POCT PREGNANCY, URINE: Preg Test, Ur: NEGATIVE

## 2022-03-14 SURGERY — LAPAROSCOPY, DIAGNOSTIC
Anesthesia: General | Site: Uterus

## 2022-03-14 MED ORDER — DEXAMETHASONE SODIUM PHOSPHATE 10 MG/ML IJ SOLN
INTRAMUSCULAR | Status: AC
  Filled 2022-03-14: qty 1

## 2022-03-14 MED ORDER — SCOPOLAMINE 1 MG/3DAYS TD PT72
MEDICATED_PATCH | TRANSDERMAL | Status: AC
  Filled 2022-03-14: qty 1

## 2022-03-14 MED ORDER — FENTANYL CITRATE (PF) 100 MCG/2ML IJ SOLN
INTRAMUSCULAR | Status: AC
  Filled 2022-03-14: qty 2

## 2022-03-14 MED ORDER — DEXAMETHASONE SODIUM PHOSPHATE 4 MG/ML IJ SOLN
INTRAMUSCULAR | Status: DC | PRN
  Administered 2022-03-14: 5 mg via INTRAVENOUS

## 2022-03-14 MED ORDER — ROCURONIUM BROMIDE 100 MG/10ML IV SOLN
INTRAVENOUS | Status: DC | PRN
  Administered 2022-03-14: 50 mg via INTRAVENOUS

## 2022-03-14 MED ORDER — MIDAZOLAM HCL 2 MG/2ML IJ SOLN
INTRAMUSCULAR | Status: AC
  Filled 2022-03-14: qty 2

## 2022-03-14 MED ORDER — SCOPOLAMINE 1 MG/3DAYS TD PT72
1.0000 | MEDICATED_PATCH | Freq: Once | TRANSDERMAL | Status: DC
  Administered 2022-03-14: 1.5 mg via TRANSDERMAL

## 2022-03-14 MED ORDER — OXYCODONE HCL 5 MG PO TABS
5.0000 mg | ORAL_TABLET | Freq: Once | ORAL | Status: AC | PRN
  Administered 2022-03-14: 5 mg via ORAL

## 2022-03-14 MED ORDER — IBUPROFEN 800 MG PO TABS: 800.0000 mg | ORAL_TABLET | Freq: Three times a day (TID) | ORAL | 0 refills | Status: AC | PRN

## 2022-03-14 MED ORDER — KETOROLAC TROMETHAMINE 30 MG/ML IJ SOLN
INTRAMUSCULAR | Status: AC
  Filled 2022-03-14: qty 1

## 2022-03-14 MED ORDER — DEXMEDETOMIDINE HCL IN NACL 80 MCG/20ML IV SOLN
INTRAVENOUS | Status: DC | PRN
  Administered 2022-03-14: 8 ug via BUCCAL

## 2022-03-14 MED ORDER — LIDOCAINE HCL (CARDIAC) PF 100 MG/5ML IV SOSY
PREFILLED_SYRINGE | INTRAVENOUS | Status: DC | PRN
  Administered 2022-03-14: 50 mg via INTRAVENOUS

## 2022-03-14 MED ORDER — ONDANSETRON HCL 4 MG/2ML IJ SOLN
INTRAMUSCULAR | Status: AC
  Filled 2022-03-14: qty 2

## 2022-03-14 MED ORDER — MIDAZOLAM HCL 5 MG/5ML IJ SOLN
INTRAMUSCULAR | Status: DC | PRN
  Administered 2022-03-14: 2 mg via INTRAVENOUS

## 2022-03-14 MED ORDER — LIDOCAINE HCL 1 % IJ SOLN
INTRAMUSCULAR | Status: DC | PRN
  Administered 2022-03-14: 10 mL

## 2022-03-14 MED ORDER — ACETAMINOPHEN 500 MG PO TABS
1000.0000 mg | ORAL_TABLET | Freq: Once | ORAL | Status: AC
  Administered 2022-03-14: 1000 mg via ORAL

## 2022-03-14 MED ORDER — OXYCODONE HCL 5 MG PO TABS
ORAL_TABLET | ORAL | Status: AC
  Filled 2022-03-14: qty 1

## 2022-03-14 MED ORDER — POVIDONE-IODINE 10 % EX SWAB: 2.0000 | Freq: Once | CUTANEOUS | Status: DC

## 2022-03-14 MED ORDER — AMISULPRIDE (ANTIEMETIC) 5 MG/2ML IV SOLN
INTRAVENOUS | Status: AC
  Filled 2022-03-14: qty 4

## 2022-03-14 MED ORDER — PROPOFOL 10 MG/ML IV BOLUS
INTRAVENOUS | Status: DC | PRN
  Administered 2022-03-14: 170 mg via INTRAVENOUS

## 2022-03-14 MED ORDER — KETOROLAC TROMETHAMINE 30 MG/ML IJ SOLN
INTRAMUSCULAR | Status: DC | PRN
  Administered 2022-03-14: 30 mg via INTRAVENOUS

## 2022-03-14 MED ORDER — SODIUM CHLORIDE 0.9 % IR SOLN
Status: DC | PRN
  Administered 2022-03-14: 3000 mL

## 2022-03-14 MED ORDER — FENTANYL CITRATE (PF) 100 MCG/2ML IJ SOLN
25.0000 ug | INTRAMUSCULAR | Status: DC | PRN
  Administered 2022-03-14: 25 ug via INTRAVENOUS

## 2022-03-14 MED ORDER — AMISULPRIDE (ANTIEMETIC) 5 MG/2ML IV SOLN
10.0000 mg | Freq: Once | INTRAVENOUS | Status: AC | PRN
  Administered 2022-03-14: 10 mg via INTRAVENOUS

## 2022-03-14 MED ORDER — LACTATED RINGERS IV SOLN: INTRAVENOUS | Status: DC

## 2022-03-14 MED ORDER — LIDOCAINE HCL (PF) 2 % IJ SOLN
INTRAMUSCULAR | Status: AC
  Filled 2022-03-14: qty 5

## 2022-03-14 MED ORDER — SUGAMMADEX SODIUM 200 MG/2ML IV SOLN
INTRAVENOUS | Status: DC | PRN
  Administered 2022-03-14: 200 mg via INTRAVENOUS

## 2022-03-14 MED ORDER — OXYCODONE HCL 5 MG PO TABS: 5.0000 mg | ORAL_TABLET | Freq: Four times a day (QID) | ORAL | 0 refills | Status: AC | PRN

## 2022-03-14 MED ORDER — OXYCODONE HCL 5 MG/5ML PO SOLN: 5.0000 mg | Freq: Once | ORAL | Status: AC | PRN

## 2022-03-14 MED ORDER — PROPOFOL 10 MG/ML IV BOLUS
INTRAVENOUS | Status: AC
  Filled 2022-03-14: qty 20

## 2022-03-14 MED ORDER — LACTATED RINGERS IV SOLN
INTRAVENOUS | Status: DC
  Administered 2022-03-14: 1000 mL via INTRAVENOUS

## 2022-03-14 MED ORDER — KETOROLAC TROMETHAMINE 15 MG/ML IJ SOLN: 15.0000 mg | INTRAMUSCULAR | Status: DC

## 2022-03-14 MED ORDER — ROCURONIUM BROMIDE 10 MG/ML (PF) SYRINGE
PREFILLED_SYRINGE | INTRAVENOUS | Status: AC
  Filled 2022-03-14: qty 10

## 2022-03-14 MED ORDER — ONDANSETRON HCL 4 MG/2ML IJ SOLN
INTRAMUSCULAR | Status: DC | PRN
  Administered 2022-03-14: 4 mg via INTRAVENOUS

## 2022-03-14 MED ORDER — BUPIVACAINE HCL (PF) 0.25 % IJ SOLN
INTRAMUSCULAR | Status: DC | PRN
  Administered 2022-03-14: 12 mL

## 2022-03-14 MED ORDER — FENTANYL CITRATE (PF) 100 MCG/2ML IJ SOLN
INTRAMUSCULAR | Status: DC | PRN
  Administered 2022-03-14 (×2): 50 ug via INTRAVENOUS

## 2022-03-14 MED ORDER — ACETAMINOPHEN 500 MG PO TABS
ORAL_TABLET | ORAL | Status: AC
  Filled 2022-03-14: qty 2

## 2022-03-14 MED ORDER — DEXMEDETOMIDINE HCL IN NACL 80 MCG/20ML IV SOLN
INTRAVENOUS | Status: AC
  Filled 2022-03-14: qty 20

## 2022-03-14 SURGICAL SUPPLY — 42 items
ADH SKN CLS APL DERMABOND .7 (GAUZE/BANDAGES/DRESSINGS) ×3
CATH ROBINSON RED A/P 16FR (CATHETERS) ×3 IMPLANT
DERMABOND ADVANCED .7 DNX12 (GAUZE/BANDAGES/DRESSINGS) ×3 IMPLANT
DRSG OPSITE POSTOP 3X4 (GAUZE/BANDAGES/DRESSINGS) IMPLANT
DRSG TELFA 3X8 NADH STRL (GAUZE/BANDAGES/DRESSINGS) ×3 IMPLANT
DURAPREP 26ML APPLICATOR (WOUND CARE) ×3 IMPLANT
ELECT REM PT RETURN 9FT ADLT (ELECTROSURGICAL)
ELECTRODE REM PT RTRN 9FT ADLT (ELECTROSURGICAL) IMPLANT
GAUZE 4X4 16PLY ~~LOC~~+RFID DBL (SPONGE) ×3 IMPLANT
GLOVE BIOGEL PI IND STRL 6.5 (GLOVE) ×6 IMPLANT
GLOVE BIOGEL PI IND STRL 7.0 (GLOVE) ×6 IMPLANT
GLOVE ECLIPSE 6.5 STRL STRAW (GLOVE) ×3 IMPLANT
GOWN STRL REUS W/TWL LRG LVL3 (GOWN DISPOSABLE) ×6 IMPLANT
KIT PROCEDURE FLUENT (KITS) ×3 IMPLANT
KIT TURNOVER CYSTO (KITS) ×3 IMPLANT
LOOP CUTTING BIPOLAR 21FR (ELECTRODE) IMPLANT
NDL INSUFFLATION 14GA 120MM (NEEDLE) ×3 IMPLANT
NEEDLE INSUFFLATION 14GA 120MM (NEEDLE) ×3 IMPLANT
NS IRRIG 1000ML POUR BTL (IV SOLUTION) ×3 IMPLANT
PACK LAPAROSCOPY BASIN (CUSTOM PROCEDURE TRAY) ×3 IMPLANT
PACK TRENDGUARD 450 HYBRID PRO (MISCELLANEOUS) IMPLANT
PACK VAGINAL MINOR WOMEN LF (CUSTOM PROCEDURE TRAY) ×3 IMPLANT
PAD OB MATERNITY 4.3X12.25 (PERSONAL CARE ITEMS) ×3 IMPLANT
PROTECTOR NERVE ULNAR (MISCELLANEOUS) ×6 IMPLANT
SCISSORS LAP 5X35 DISP (ENDOMECHANICALS) IMPLANT
SEAL ROD LENS SCOPE MYOSURE (ABLATOR) ×3 IMPLANT
SET SUCTION IRRIG HYDROSURG (IRRIGATION / IRRIGATOR) IMPLANT
SET TUBE SMOKE EVAC HIGH FLOW (TUBING) ×3 IMPLANT
SHEARS HARMONIC ACE PLUS 36CM (ENDOMECHANICALS) IMPLANT
SUT VIC AB 3-0 PS2 18 (SUTURE) ×3
SUT VIC AB 3-0 PS2 18XBRD (SUTURE) ×3 IMPLANT
SUT VICRYL 0 UR6 27IN ABS (SUTURE) ×3 IMPLANT
SYS BAG RETRIEVAL 10MM (BASKET)
SYS RETRIEVAL 5MM INZII UNIV (BASKET)
SYSTEM BAG RETRIEVAL 10MM (BASKET) IMPLANT
SYSTEM RETRIEVL 5MM INZII UNIV (BASKET) IMPLANT
TOWEL OR 17X26 10 PK STRL BLUE (TOWEL DISPOSABLE) ×3 IMPLANT
TRAY FOLEY W/BAG SLVR 14FR LF (SET/KITS/TRAYS/PACK) ×3 IMPLANT
TRENDGUARD 450 HYBRID PRO PACK (MISCELLANEOUS)
TROCAR Z-THREAD FIOS 11X100 BL (TROCAR) IMPLANT
TROCAR Z-THREAD FIOS 5X100MM (TROCAR) ×6 IMPLANT
WARMER LAPAROSCOPE (MISCELLANEOUS) ×3 IMPLANT

## 2022-03-14 NOTE — Transfer of Care (Signed)
Immediate Anesthesia Transfer of Care Note  Patient: ZAKIAH GAUTHREAUX  Procedure(s) Performed: LAPAROSCOPY DIAGNOSTIC/lysis of adhesions ABLATION ON ENDOMETRIOSIS (Pelvis) diagnostic hysteroscopy with currettage (Uterus)  Patient Location: PACU  Anesthesia Type:General  Level of Consciousness: awake, alert , and oriented  Airway & Oxygen Therapy: Patient Spontanous Breathing and Patient connected to nasal cannula oxygen  Post-op Assessment: Report given to RN and Post -op Vital signs reviewed and stable  Post vital signs: Reviewed and stable  Last Vitals:  Vitals Value Taken Time  BP 111/72 03/14/22 1025  Temp    Pulse 57 03/14/22 1029  Resp 15 03/14/22 1029  SpO2 100 % 03/14/22 1029  Vitals shown include unvalidated device data.  Last Pain:  Vitals:   03/14/22 0709  TempSrc: Oral  PainSc: 4       Patients Stated Pain Goal: 8 (03/14/22 0709)  Complications: No notable events documented.

## 2022-03-14 NOTE — Anesthesia Preprocedure Evaluation (Addendum)
Anesthesia Evaluation  Patient identified by MRN, date of birth, ID band Patient awake    Reviewed: Allergy & Precautions, NPO status , Patient's Chart, lab work & pertinent test results  History of Anesthesia Complications Negative for: history of anesthetic complications  Airway Mallampati: I  TM Distance: >3 FB Neck ROM: Full    Dental no notable dental hx.    Pulmonary neg pulmonary ROS   Pulmonary exam normal        Cardiovascular negative cardio ROS Normal cardiovascular exam     Neuro/Psych  Headaches  Anxiety Depression       GI/Hepatic negative GI ROS, Neg liver ROS,,,  Endo/Other  negative endocrine ROS    Renal/GU negative Renal ROS     Musculoskeletal negative musculoskeletal ROS (+)    Abdominal   Peds  Hematology negative hematology ROS (+)   Anesthesia Other Findings Day of surgery medications reviewed with patient.  Reproductive/Obstetrics endometriosis                             Anesthesia Physical Anesthesia Plan  ASA: 2  Anesthesia Plan: General   Post-op Pain Management: Tylenol PO (pre-op)* and Toradol IV (intra-op)*   Induction: Intravenous  PONV Risk Score and Plan: 3 and Treatment may vary due to age or medical condition, Midazolam, Scopolamine patch - Pre-op, Dexamethasone and Ondansetron  Airway Management Planned: Oral ETT  Additional Equipment: None  Intra-op Plan:   Post-operative Plan: Extubation in OR  Informed Consent: I have reviewed the patients History and Physical, chart, labs and discussed the procedure including the risks, benefits and alternatives for the proposed anesthesia with the patient or authorized representative who has indicated his/her understanding and acceptance.     Dental advisory given  Plan Discussed with: CRNA  Anesthesia Plan Comments:        Anesthesia Quick Evaluation

## 2022-03-14 NOTE — Interval H&P Note (Signed)
History and Physical Interval Note:  03/14/2022 8:17 AM  Anna Bennett  has presented today for surgery, with the diagnosis of pelvic pain.  The various methods of treatment have been discussed with the patient and family. After consideration of risks, benefits and other options for treatment, the patient has consented to  Procedure(s): LAPAROSCOPY DIAGNOSTIC (N/A) ABLATION ON ENDOMETRIOSIS (N/A) HYSTEROSCOPY WITH RESECTION OF UTERINE SYNECHIAE (N/A) as a surgical intervention.  The patient's history has been reviewed, patient examined, no change in status, stable for surgery.  I have reviewed the patient's chart and labs.  Questions were answered to the patient's satisfaction.     Angelyse Heslin M Kailash Hinze

## 2022-03-14 NOTE — Anesthesia Postprocedure Evaluation (Signed)
Anesthesia Post Note  Patient: SPENSER CONG  Procedure(s) Performed: LAPAROSCOPY DIAGNOSTIC/lysis of adhesions ABLATION ON ENDOMETRIOSIS (Pelvis) diagnostic hysteroscopy with currettage (Uterus)     Patient location during evaluation: PACU Anesthesia Type: General Level of consciousness: awake and alert Pain management: pain level controlled Vital Signs Assessment: post-procedure vital signs reviewed and stable Respiratory status: spontaneous breathing, nonlabored ventilation and respiratory function stable Cardiovascular status: blood pressure returned to baseline Postop Assessment: no apparent nausea or vomiting Anesthetic complications: no   No notable events documented.  Last Vitals:  Vitals:   03/14/22 1045 03/14/22 1100  BP: 107/70 110/73  Pulse: (!) 55 (!) 57  Resp: 16 16  Temp:    SpO2: 100% 100%    Last Pain:  Vitals:   03/14/22 1100  TempSrc:   PainSc: Asleep                 Shanda Howells

## 2022-03-14 NOTE — Op Note (Signed)
03/14/22   Surgeon: Ellison Hughs, MD Preoperative Diagnoses: Chronic pelvic pain Postoperative Diagnoses: Likely endometriosis   Procedures performed:  1) Diagnostic laparoscopy 2) Lysis of adhesions 3) Hysteroscopy dilation and curettage   IVF: 600cc LR EBL: <50cc UOP: 75cc clear yellow urine via red rubber  Fluid deficit: 30cc   Anesthesia: LMA  Findings: External genitalia WNL. Cervix appears grossly WNL, mobile 7-8cm anteverted, mobile uterus on BME. BL adnexa WNL. Upon insertion of laparoscope, bowel adhesions noted along left lateral abdominal wall. Minline peritoneal band noted in posterior CDS from cervicovaginal junction to CDs peritoneum. Peritoneal window noted immediate inferior to left USL. BL fallopian tubes and ovaries WNL. Normal uterine contour.  Hysteroscopic exam notes thin lining, BL ostia visualized without issue. No synechiae noted as in preop imaging, grossly normal cavity    Indications: Anna Bennett is a 26yo G0 with chronic pelvic pain. Has failed NSAID and contraceptive management of pain over several years. Pt desires surgical management of CPP. Consented for above procedures   RBA:  T Risks of Dx Lap (possible ablation of endometriosis) include infection of the uterus, pelvic organs, or skin, inadvertent injury to internal organs, such as bowel or bladder. If there is major injury, extensive surgery may be required. If injury is minor, it may be treated with relative ease. Discussed possibility of excessive blood loss and transfusion. Patient accepts the possibility of blood transfusion, if necessary. Patient understands and agrees to move forward with surgery. -Counseled on hysteroscopy D&C  The patient was informed of the risks and benefits of a hysteroscopy with dilation and curettage. Risks included but were not limited to bleeidng, infections, injury to the vulva, vagina or cerivx, or uterine perforation. If concern for latter, may proceed with diagnostic  laparoscopy for evaluation of injury which may require surgical repair. Patient understands and is amenable.   Operative details: Patient was taken to operating room where general anesthesia via LMA was established. Placed in dorsal lithotomy with appropriate padded points. No antibiotics given preop via ACOG recommendations. Time out was performed after vaginal prep was carried out with Betadine. Tenaculum then placed on anterior cervical lip. Hulka manipulator placed without issue. Foley catheter then placed.   Attention was then turned to the patient's abdomen after draping where the infraumbilical area was injected with approximately 10 cc of quarter percent Marcaine. A 1 cm incision was then made within the umbilicus and a 66mm trochar was then inserted under direct visualization. Gas flow was then applied and a pneumoperitoneum obtained with approximate 3 L of CO2 gas. With patient in Trendelenburg the uterus and tubes and ovaries were inspected with findings as previously stated. 2 additional 39mm ports were placed in LLQ and RLQ by palpating 2cm above and 2cm medial to corresponding ASIS, transilluminating during Marcaine injeciton and port placement. A bipolar cautery was then introduced through the LLQ port. Omental adhesions as above taken down. Midline peritoneal band excised within posterior CDS. All sites hemostatic  Attention then turned back to cervix. Pratt dilators used (21) to dilate internal os. Gentle traction used to dilate internal os. Upon insertion of Myosure hysteroscope, findings noted as above.  D&C carried out in gentle fashion clockwise until gritty texture noted throughout  Attention turned back to abdomen. The instruments were removed from the abdomen and the pneumoperitoneum reduced through the trocar. The trocar was finally removed and the infraumbilical incision closed with a subcuticular stitch of 3-0 Vicryl. Dermabond was placed.  All instruments removed from vagina.  Fluid deficit controlled throughout, final deficit as above  Patient tolerated procedure well. All counts correct at end of procedure

## 2022-03-14 NOTE — Anesthesia Procedure Notes (Signed)
Procedure Name: Intubation Date/Time: 03/14/2022 9:13 AM  Performed by: Cleda Clarks, CRNAPre-anesthesia Checklist: Patient identified, Emergency Drugs available, Suction available and Patient being monitored Patient Re-evaluated:Patient Re-evaluated prior to induction Oxygen Delivery Method: Circle system utilized Preoxygenation: Pre-oxygenation with 100% oxygen Induction Type: IV induction Ventilation: Mask ventilation without difficulty Laryngoscope Size: Miller and 2 Tube type: Oral Tube size: 7.0 mm Number of attempts: 1 Airway Equipment and Method: Stylet Placement Confirmation: ETT inserted through vocal cords under direct vision, positive ETCO2 and breath sounds checked- equal and bilateral Secured at: 21 cm Tube secured with: Tape Dental Injury: Teeth and Oropharynx as per pre-operative assessment

## 2022-03-14 NOTE — Discharge Instructions (Addendum)
No ibuprofen, Advil, Aleve, Motrin, ketorolac, meloxicam, naproxen, or other NSAIDS until after 4:03 pm today if needed.  No acetaminophen/Tylenol until after 1:39 pm today if needed.      D & C Home care Instructions:   Personal hygiene:  Used sanitary napkins for vaginal drainage not tampons. Shower or tub bathe the day after your procedure. No douching until bleeding stops. Always wipe from front to back after  Elimination.  Activity: Do not drive or operate any equipment today. The effects of the anesthesia are still present and drowsiness may result. Rest today, not necessarily flat bed rest, just take it easy. You may resume your normal activity in one to 2 days.  Sexual activity: No intercourse for one week or as indicated by your physician  Diet: Eat a light diet as desired this evening. You may resume a regular diet tomorrow.  Return to work: One to 2 days.  General Expectations of your surgery: Vaginal bleeding should be no heavier than a normal period. Spotting may continue up to 10 days. Mild cramps may continue for a couple of days. You may have a regular period in 2-6 weeks.  Unexpected observations call your doctor if these occur: persistent or heavy bleeding. Severe abdominal cramping or pain. Elevation of temperature greater than 100F.  Call for an appointment in one week.    Patient's Signature_______________________________________________________  Nurse's Signature________________________________________________________       DISCHARGE INSTRUCTIONS: HYSTEROSCOPY / ENDOMETRIAL ABLATION The following instructions have been prepared to help you care for yourself upon your return home.  May Remove Scop patch on or before  May take Ibuprofen after  May take stool softner while taking narcotic pain medication to prevent constipation.  Drink plenty of water.  Personal hygiene:  Use sanitary pads for vaginal drainage, not tampons.  Shower the day after  your procedure.  NO tub baths, pools or Jacuzzis for 2-3 weeks.  Wipe front to back after using the bathroom.  Activity and limitations:  Do NOT drive or operate any equipment for 24 hours. The effects of anesthesia are still present and drowsiness may result.  Do NOT rest in bed all day.  Walking is encouraged.  Walk up and down stairs slowly.  You may resume your normal activity in one to two days or as indicated by your physician. Sexual activity: NO intercourse for at least 2 weeks after the procedure, or as indicated by your Doctor.  Diet: Eat a light meal as desired this evening. You may resume your usual diet tomorrow.  Return to Work: You may resume your work activities in one to two days or as indicated by Therapist, sports.  What to expect after your surgery: Expect to have vaginal bleeding/discharge for 2-3 days and spotting for up to 10 days. It is not unusual to have soreness for up to 1-2 weeks. You may have a slight burning sensation when you urinate for the first day. Mild cramps may continue for a couple of days. You may have a regular period in 2-6 weeks.  Call your doctor for any of the following:  Excessive vaginal bleeding or clotting, saturating and changing one pad every hour.  Inability to urinate 6 hours after discharge from hospital.  Pain not relieved by pain medication.  Fever of 100.4 F or greater.  Unusual vaginal discharge or odor.  Return to office _________________Call for an appointment ___________________ Patient's signature: ______________________ Nurse's signature ________________________  Post Anesthesia Care Unit [phone]  Post Anesthesia Home Care Instructions  Activity: Get plenty of rest for the remainder of the day. A responsible individual must stay with you for 24 hours following the procedure.  For the next 24 hours, DO NOT: -Drive a car -Advertising copywriter -Drink alcoholic beverages -Take any medication unless  instructed by your physician -Make any legal decisions or sign important papers.  Meals: Start with liquid foods such as gelatin or soup. Progress to regular foods as tolerated. Avoid greasy, spicy, heavy foods. If nausea and/or vomiting occur, drink only clear liquids until the nausea and/or vomiting subsides. Call your physician if vomiting continues.  Special Instructions/Symptoms: Your throat may feel dry or sore from the anesthesia or the breathing tube placed in your throat during surgery. If this causes discomfort, gargle with warm salt water. The discomfort should disappear within 24 hours.  If you had a scopolamine patch placed behind your ear for the management of post- operative nausea and/or vomiting:  1. The medication in the patch is effective for 72 hours, after which it should be removed.  Wrap patch in a tissue and discard in the trash. Wash hands thoroughly with soap and water. 2. You may remove the patch earlier than 72 hours if you experience unpleasant side effects which may include dry mouth, dizziness or visual disturbances. 3. Avoid touching the patch. Wash your hands with soap and water after contact with the patch.

## 2022-03-17 ENCOUNTER — Encounter (HOSPITAL_BASED_OUTPATIENT_CLINIC_OR_DEPARTMENT_OTHER): Payer: Self-pay | Admitting: Obstetrics and Gynecology

## 2022-03-17 LAB — SURGICAL PATHOLOGY

## 2022-04-18 DIAGNOSIS — F908 Attention-deficit hyperactivity disorder, other type: Secondary | ICD-10-CM | POA: Diagnosis not present

## 2022-04-18 DIAGNOSIS — Z79899 Other long term (current) drug therapy: Secondary | ICD-10-CM | POA: Diagnosis not present

## 2022-05-30 ENCOUNTER — Other Ambulatory Visit (HOSPITAL_COMMUNITY): Payer: Self-pay

## 2022-05-30 DIAGNOSIS — Z79899 Other long term (current) drug therapy: Secondary | ICD-10-CM | POA: Diagnosis not present

## 2022-05-30 DIAGNOSIS — F902 Attention-deficit hyperactivity disorder, combined type: Secondary | ICD-10-CM | POA: Diagnosis not present

## 2022-05-30 MED ORDER — METHYLPHENIDATE HCL ER (CD) 20 MG PO CPCR
20.0000 mg | ORAL_CAPSULE | Freq: Every day | ORAL | 0 refills | Status: DC
  Filled 2022-05-30: qty 30, 30d supply, fill #0

## 2022-06-02 DIAGNOSIS — Z5181 Encounter for therapeutic drug level monitoring: Secondary | ICD-10-CM | POA: Diagnosis not present

## 2022-06-02 DIAGNOSIS — Z79899 Other long term (current) drug therapy: Secondary | ICD-10-CM | POA: Diagnosis not present

## 2022-06-02 DIAGNOSIS — Z79891 Long term (current) use of opiate analgesic: Secondary | ICD-10-CM | POA: Diagnosis not present

## 2022-07-05 ENCOUNTER — Other Ambulatory Visit (HOSPITAL_COMMUNITY): Payer: Self-pay

## 2022-07-05 MED ORDER — METHYLPHENIDATE HCL ER (CD) 20 MG PO CPCR
20.0000 mg | ORAL_CAPSULE | Freq: Every day | ORAL | 0 refills | Status: DC
  Filled 2022-07-05: qty 30, 30d supply, fill #0

## 2022-07-08 ENCOUNTER — Other Ambulatory Visit (HOSPITAL_COMMUNITY): Payer: Self-pay

## 2022-08-11 ENCOUNTER — Other Ambulatory Visit (HOSPITAL_COMMUNITY): Payer: Self-pay

## 2022-08-11 MED ORDER — METHYLPHENIDATE HCL ER (CD) 20 MG PO CPCR
20.0000 mg | ORAL_CAPSULE | Freq: Every day | ORAL | 0 refills | Status: DC
  Filled 2022-08-11: qty 30, 30d supply, fill #0

## 2022-08-14 ENCOUNTER — Telehealth: Payer: Self-pay

## 2022-08-14 NOTE — Telephone Encounter (Signed)
LVM for patient to call back. AS, CMA 

## 2022-08-29 DIAGNOSIS — Z79899 Other long term (current) drug therapy: Secondary | ICD-10-CM | POA: Diagnosis not present

## 2022-08-29 DIAGNOSIS — F908 Attention-deficit hyperactivity disorder, other type: Secondary | ICD-10-CM | POA: Diagnosis not present

## 2022-08-29 DIAGNOSIS — F902 Attention-deficit hyperactivity disorder, combined type: Secondary | ICD-10-CM | POA: Diagnosis not present

## 2022-09-15 ENCOUNTER — Other Ambulatory Visit (HOSPITAL_COMMUNITY): Payer: Self-pay

## 2022-09-15 MED ORDER — METHYLPHENIDATE HCL ER (CD) 20 MG PO CPCR
20.0000 mg | ORAL_CAPSULE | Freq: Every day | ORAL | 0 refills | Status: DC
  Filled 2022-09-15: qty 30, 30d supply, fill #0
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
## 2022-10-17 DIAGNOSIS — Z0000 Encounter for general adult medical examination without abnormal findings: Secondary | ICD-10-CM | POA: Diagnosis not present

## 2022-10-17 DIAGNOSIS — F900 Attention-deficit hyperactivity disorder, predominantly inattentive type: Secondary | ICD-10-CM | POA: Diagnosis not present

## 2022-10-17 DIAGNOSIS — Z13220 Encounter for screening for lipoid disorders: Secondary | ICD-10-CM | POA: Diagnosis not present

## 2022-10-20 ENCOUNTER — Other Ambulatory Visit (HOSPITAL_COMMUNITY): Payer: Self-pay

## 2022-10-20 MED ORDER — METHYLPHENIDATE HCL ER (CD) 20 MG PO CPCR
20.0000 mg | ORAL_CAPSULE | Freq: Every day | ORAL | 0 refills | Status: DC
  Filled 2022-10-20: qty 30, 30d supply, fill #0

## 2022-10-24 ENCOUNTER — Other Ambulatory Visit (HOSPITAL_COMMUNITY): Payer: Self-pay

## 2022-11-14 DIAGNOSIS — Z01419 Encounter for gynecological examination (general) (routine) without abnormal findings: Secondary | ICD-10-CM | POA: Diagnosis not present

## 2022-11-20 DIAGNOSIS — L910 Hypertrophic scar: Secondary | ICD-10-CM | POA: Diagnosis not present

## 2022-11-26 ENCOUNTER — Other Ambulatory Visit (HOSPITAL_COMMUNITY): Payer: Self-pay

## 2022-11-26 MED ORDER — METHYLPHENIDATE HCL ER (CD) 20 MG PO CPCR
20.0000 mg | ORAL_CAPSULE | Freq: Every day | ORAL | 0 refills | Status: DC
  Filled 2022-11-26: qty 30, 30d supply, fill #0

## 2022-12-26 ENCOUNTER — Other Ambulatory Visit (HOSPITAL_COMMUNITY): Payer: Self-pay

## 2022-12-26 DIAGNOSIS — F902 Attention-deficit hyperactivity disorder, combined type: Secondary | ICD-10-CM | POA: Diagnosis not present

## 2022-12-26 DIAGNOSIS — Z79899 Other long term (current) drug therapy: Secondary | ICD-10-CM | POA: Diagnosis not present

## 2022-12-26 MED ORDER — METHYLPHENIDATE HCL ER (CD) 20 MG PO CPCR
20.0000 mg | ORAL_CAPSULE | Freq: Every day | ORAL | 0 refills | Status: AC
  Filled 2022-12-26: qty 30, 30d supply, fill #0

## 2022-12-27 ENCOUNTER — Other Ambulatory Visit (HOSPITAL_COMMUNITY): Payer: Self-pay

## 2023-01-01 DIAGNOSIS — H01134 Eczematous dermatitis of left upper eyelid: Secondary | ICD-10-CM | POA: Diagnosis not present

## 2023-01-01 DIAGNOSIS — H01131 Eczematous dermatitis of right upper eyelid: Secondary | ICD-10-CM | POA: Diagnosis not present

## 2023-03-16 DIAGNOSIS — Z30430 Encounter for insertion of intrauterine contraceptive device: Secondary | ICD-10-CM | POA: Diagnosis not present
